# Patient Record
Sex: Female | Born: 1960 | Race: White | Hispanic: No | Marital: Single | Smoking: Never smoker
Health system: Southern US, Community
[De-identification: ages and names within clinical notes are randomized; demographics above are authoritative.]

## PROBLEM LIST (undated history)

## (undated) DIAGNOSIS — F419 Anxiety disorder, unspecified: Secondary | ICD-10-CM

## (undated) DIAGNOSIS — M5416 Radiculopathy, lumbar region: Secondary | ICD-10-CM

## (undated) DIAGNOSIS — M1990 Unspecified osteoarthritis, unspecified site: Secondary | ICD-10-CM

## (undated) DIAGNOSIS — R110 Nausea: Secondary | ICD-10-CM

## (undated) DIAGNOSIS — K219 Gastro-esophageal reflux disease without esophagitis: Secondary | ICD-10-CM

## (undated) DIAGNOSIS — R9431 Abnormal electrocardiogram [ECG] [EKG]: Secondary | ICD-10-CM

## (undated) DIAGNOSIS — I10 Essential (primary) hypertension: Secondary | ICD-10-CM

## (undated) DIAGNOSIS — K8020 Calculus of gallbladder without cholecystitis without obstruction: Secondary | ICD-10-CM

## (undated) DIAGNOSIS — R609 Edema, unspecified: Secondary | ICD-10-CM

## (undated) DIAGNOSIS — E559 Vitamin D deficiency, unspecified: Secondary | ICD-10-CM

## (undated) DIAGNOSIS — E669 Obesity, unspecified: Secondary | ICD-10-CM

## (undated) DIAGNOSIS — N200 Calculus of kidney: Secondary | ICD-10-CM

## (undated) DIAGNOSIS — R739 Hyperglycemia, unspecified: Secondary | ICD-10-CM

## (undated) DIAGNOSIS — K859 Acute pancreatitis without necrosis or infection, unspecified: Secondary | ICD-10-CM

## (undated) DIAGNOSIS — M25561 Pain in right knee: Secondary | ICD-10-CM

## (undated) DIAGNOSIS — E785 Hyperlipidemia, unspecified: Secondary | ICD-10-CM

## (undated) DIAGNOSIS — J309 Allergic rhinitis, unspecified: Secondary | ICD-10-CM

## (undated) DIAGNOSIS — N390 Urinary tract infection, site not specified: Secondary | ICD-10-CM

## (undated) HISTORY — DX: Nausea: R11.0

## (undated) HISTORY — DX: Vitamin D deficiency, unspecified: E55.9

## (undated) HISTORY — DX: Urinary tract infection, site not specified: N39.0

## (undated) HISTORY — DX: Edema, unspecified: R60.9

## (undated) HISTORY — DX: Obesity, unspecified: E66.9

## (undated) HISTORY — DX: Hyperglycemia, unspecified: R73.9

## (undated) HISTORY — DX: Pain in right knee: M25.561

## (undated) HISTORY — DX: Gastro-esophageal reflux disease without esophagitis: K21.9

## (undated) HISTORY — DX: Acute pancreatitis without necrosis or infection, unspecified: K85.90

## (undated) HISTORY — DX: Calculus of kidney: N20.0

## (undated) HISTORY — DX: Radiculopathy, lumbar region: M54.16

## (undated) HISTORY — DX: Calculus of gallbladder without cholecystitis without obstruction: K80.20

## (undated) HISTORY — DX: Anxiety disorder, unspecified: F41.9

## (undated) HISTORY — DX: Unspecified osteoarthritis, unspecified site: M19.90

## (undated) HISTORY — DX: Abnormal electrocardiogram (ECG) (EKG): R94.31

## (undated) HISTORY — DX: Allergic rhinitis, unspecified: J30.9

## (undated) HISTORY — DX: Essential (primary) hypertension: I10

## (undated) HISTORY — DX: Hyperlipidemia, unspecified: E78.5

## (undated) HISTORY — PX: CHOLECYSTECTOMY: SHX55

---
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
## 1981-12-20 HISTORY — PX: OTHER SURGICAL HISTORY: SHX169

## 1986-12-20 HISTORY — PX: CERVICAL ABLATION: SHX5771

## 1999-03-09 ENCOUNTER — Other Ambulatory Visit: Admission: RE | Admit: 1999-03-09 | Discharge: 1999-03-09 | Payer: Self-pay | Admitting: Obstetrics & Gynecology

## 2001-01-02 ENCOUNTER — Other Ambulatory Visit: Admission: RE | Admit: 2001-01-02 | Discharge: 2001-01-02 | Payer: Self-pay | Admitting: Obstetrics & Gynecology

## 2003-07-29 ENCOUNTER — Encounter: Admission: RE | Admit: 2003-07-29 | Discharge: 2003-07-29 | Payer: Self-pay | Admitting: Obstetrics & Gynecology

## 2003-07-29 ENCOUNTER — Other Ambulatory Visit: Admission: RE | Admit: 2003-07-29 | Discharge: 2003-07-29 | Payer: Self-pay | Admitting: Obstetrics & Gynecology

## 2003-07-29 ENCOUNTER — Encounter: Payer: Self-pay | Admitting: Obstetrics & Gynecology

## 2004-01-27 ENCOUNTER — Encounter: Admission: RE | Admit: 2004-01-27 | Discharge: 2004-01-27 | Payer: Self-pay | Admitting: Obstetrics & Gynecology

## 2004-08-10 ENCOUNTER — Other Ambulatory Visit: Admission: RE | Admit: 2004-08-10 | Discharge: 2004-08-10 | Payer: Self-pay | Admitting: Obstetrics & Gynecology

## 2004-08-10 ENCOUNTER — Encounter: Admission: RE | Admit: 2004-08-10 | Discharge: 2004-08-10 | Payer: Self-pay | Admitting: Obstetrics & Gynecology

## 2005-11-22 ENCOUNTER — Other Ambulatory Visit: Admission: RE | Admit: 2005-11-22 | Discharge: 2005-11-22 | Payer: Self-pay | Admitting: Obstetrics & Gynecology

## 2011-01-09 ENCOUNTER — Encounter: Payer: Self-pay | Admitting: Obstetrics & Gynecology

## 2013-05-15 ENCOUNTER — Other Ambulatory Visit: Payer: Self-pay | Admitting: Internal Medicine

## 2013-05-15 ENCOUNTER — Other Ambulatory Visit: Payer: Self-pay | Admitting: Obstetrics & Gynecology

## 2013-05-15 DIAGNOSIS — R928 Other abnormal and inconclusive findings on diagnostic imaging of breast: Secondary | ICD-10-CM

## 2013-10-05 ENCOUNTER — Ambulatory Visit
Admission: RE | Admit: 2013-10-05 | Discharge: 2013-10-05 | Disposition: A | Discharge status: Home / Self Care | Payer: No Typology Code available for payment source | Source: Ambulatory Visit | Attending: Obstetrics & Gynecology | Admitting: Obstetrics & Gynecology

## 2013-10-05 DIAGNOSIS — R928 Other abnormal and inconclusive findings on diagnostic imaging of breast: Secondary | ICD-10-CM

## 2017-04-08 ENCOUNTER — Encounter: Payer: Self-pay | Admitting: Internal Medicine

## 2017-05-13 ENCOUNTER — Ambulatory Visit: Payer: No Typology Code available for payment source | Admitting: Internal Medicine

## 2017-05-23 ENCOUNTER — Ambulatory Visit (INDEPENDENT_AMBULATORY_CARE_PROVIDER_SITE_OTHER): Payer: Managed Care, Other (non HMO) | Admitting: Internal Medicine

## 2017-05-23 ENCOUNTER — Encounter (INDEPENDENT_AMBULATORY_CARE_PROVIDER_SITE_OTHER): Payer: Self-pay

## 2017-05-23 ENCOUNTER — Encounter: Payer: Self-pay | Admitting: Internal Medicine

## 2017-05-23 VITALS — BP 128/84 | HR 74 | Ht 63.0 in | Wt 273.0 lb

## 2017-05-23 DIAGNOSIS — Z1211 Encounter for screening for malignant neoplasm of colon: Secondary | ICD-10-CM | POA: Diagnosis not present

## 2017-05-23 DIAGNOSIS — K529 Noninfective gastroenteritis and colitis, unspecified: Secondary | ICD-10-CM | POA: Diagnosis not present

## 2017-05-23 DIAGNOSIS — R768 Other specified abnormal immunological findings in serum: Secondary | ICD-10-CM

## 2017-05-23 NOTE — Patient Instructions (Signed)
  You have been scheduled for a colonoscopy. Please follow written instructions given to you at your visit today.  Please pick up your prep supplies at the pharmacy. If you use inhalers (even only as needed), please bring them with you on the day of your procedure. Your physician has requested that you go to www.startemmi.com and enter the access code given to you at your visit today. This web site gives a general overview about your procedure. However, you should still follow specific instructions given to you by our office regarding your preparation for the procedure.     I appreciate the opportunity to care for you. Carl Gessner, MD, FACG 
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
## 2017-05-28 ENCOUNTER — Encounter: Payer: Self-pay | Admitting: Internal Medicine

## 2017-05-28 DIAGNOSIS — F419 Anxiety disorder, unspecified: Secondary | ICD-10-CM | POA: Insufficient documentation

## 2017-05-28 DIAGNOSIS — Z6841 Body Mass Index (BMI) 40.0 and over, adult: Secondary | ICD-10-CM

## 2017-08-03 ENCOUNTER — Encounter: Payer: Self-pay | Admitting: Internal Medicine

## 2017-08-03 ENCOUNTER — Ambulatory Visit (AMBULATORY_SURGERY_CENTER): Payer: Managed Care, Other (non HMO) | Admitting: Internal Medicine

## 2017-08-03 VITALS — BP 120/76 | HR 78 | Temp 98.4°F | Resp 19 | Ht 63.0 in | Wt 273.0 lb

## 2017-08-03 DIAGNOSIS — K621 Rectal polyp: Secondary | ICD-10-CM

## 2017-08-03 DIAGNOSIS — Z1211 Encounter for screening for malignant neoplasm of colon: Secondary | ICD-10-CM

## 2017-08-03 DIAGNOSIS — D129 Benign neoplasm of anus and anal canal: Secondary | ICD-10-CM

## 2017-08-03 DIAGNOSIS — Z1212 Encounter for screening for malignant neoplasm of rectum: Secondary | ICD-10-CM | POA: Diagnosis not present

## 2017-08-03 DIAGNOSIS — D128 Benign neoplasm of rectum: Secondary | ICD-10-CM

## 2017-08-03 MED ORDER — SODIUM CHLORIDE 0.9 % IV SOLN: 500.0000 mL | INTRAVENOUS | Status: AC

## 2017-08-03 NOTE — Op Note (Signed)
[facility] Patient Name: Caitlyn Rodriguez Procedure Date: 08/03/2017 9:42 AM MRN: [id number] Endoscopist: Gatha Mayer , MD Age: 56 Referring MD:  Date of Birth: 07/09/1961 Gender: Female Account #: [id number] Procedure:                Colonoscopy Indications:              Screening for colorectal malignant neoplasm, This                            is the patient's first colonoscopy Medicines:                Propofol per Anesthesia, Monitored Anesthesia Care Procedure:                Pre-Anesthesia Assessment:                           - Prior to the procedure, a History and Physical                            was performed, and patient medications and                            allergies were reviewed. The patient's tolerance of                            previous anesthesia was also reviewed. The risks                            and benefits of the procedure and the sedation                            options and risks were discussed with the patient.                            All questions were answered, and informed consent                            was obtained. Prior Anticoagulants: The patient has                            taken no previous anticoagulant or antiplatelet                            agents. ASA Grade Assessment: III - A patient with                            severe systemic disease. After reviewing the risks                            and benefits, the patient was deemed in                            satisfactory condition to undergo the procedure.  After obtaining informed consent, the colonoscope                            was passed under direct vision. Throughout the                            procedure, the patient's blood pressure, pulse, and                            oxygen saturations were monitored continuously. The                            Colonoscope was introduced through the anus and   advanced to the the cecum, identified by                            appendiceal orifice and ileocecal valve. The                            colonoscopy was performed without difficulty. The                            patient tolerated the procedure well. The quality                            of the bowel preparation was excellent. The bowel                            preparation used was Miralax. The ileocecal valve,                            appendiceal orifice, and rectum were photographed. Scope In: 2:95:18 AM Scope Out: 9:56:53 AM Scope Withdrawal Time: 0 hours 7 minutes 54 seconds  Total Procedure Duration: 0 hours 10 minutes 19 seconds  Findings:                 The perianal and digital rectal examinations were                            normal.                           A diminutive polyp was found in the rectum. The                            polyp was sessile. The polyp was removed with a                            cold snare. Resection and retrieval were complete.                            Verification of patient identification for the                            specimen was done.  Estimated blood loss was minimal.                           Scattered diverticula were found in the transverse                            colon.                           The exam was otherwise without abnormality on                            direct and retroflexion views. Complications:            No immediate complications. Estimated Blood Loss:     Estimated blood loss was minimal. Impression:               - One diminutive polyp in the rectum, removed with                            a cold snare. Resected and retrieved.                           - Diverticulosis in the transverse colon.                           - The examination was otherwise normal on direct                            and retroflexion views. Recommendation:           - Patient has a contact number available for                             emergencies. The signs and symptoms of potential                            delayed complications were discussed with the                            patient. Return to normal activities tomorrow.                            Written discharge instructions were provided to the                            patient.                           - Resume previous diet.                           - Continue present medications.                           - Repeat colonoscopy is recommended. The  colonoscopy date will be determined after pathology                            results from today's exam become available for                            review. Gatha Mayer, MD 08/03/2017 10:02:25 AM This report has been signed electronically.

## 2017-08-03 NOTE — Progress Notes (Signed)
Report to PACU, RN, vss, BBS= Clear.  

## 2017-08-03 NOTE — Patient Instructions (Addendum)
I found and removed one small polyp that looks benign.  Mild diverticulosis.  I will let you know pathology results and when to have another routine colonoscopy by mail and/or My Chart. YOU HAD AN ENDOSCOPIC PROCEDURE TODAY AT [facility]:   Refer to the procedure report that was given to you for any specific questions about what was found during the examination.  If the procedure report does not answer your questions, please call your gastroenterologist to clarify.  If you requested that your care partner not be given the details of your procedure findings, then the procedure report has been included in a sealed envelope for you to review at your convenience later.  YOU SHOULD EXPECT: Some feelings of bloating in the abdomen. Passage of more gas than usual.  Walking can help get rid of the air that was put into your GI tract during the procedure and reduce the bloating. If you had a lower endoscopy (such as a colonoscopy or flexible sigmoidoscopy) you may notice spotting of blood in your stool or on the toilet paper. If you underwent a bowel prep for your procedure, you may not have a normal bowel movement for a few days.  Please Note:  You might notice some irritation and congestion in your nose or some drainage.  This is from the oxygen used during your procedure.  There is no need for concern and it should clear up in a day or so.  SYMPTOMS TO REPORT IMMEDIATELY:   Following lower endoscopy (colonoscopy or flexible sigmoidoscopy):  Excessive amounts of blood in the stool  Significant tenderness or worsening of abdominal pains  Swelling of the abdomen that is new, acute  Fever of 100F or higher   Following upper endoscopy (EGD)  Vomiting of blood or coffee ground material  New chest pain or pain under the shoulder blades  Painful or persistently difficult swallowing  New shortness of breath  Fever of 100F or higher  Black, tarry-looking stools  For urgent  or emergent issues, a gastroenterologist can be reached at any hour by calling [phone].   DIET:  We do recommend a small meal at first, but then you may proceed to your regular diet.  Drink plenty of fluids but you should avoid alcoholic beverages for 24 hours.  ACTIVITY:  You should plan to take it easy for the rest of today and you should NOT DRIVE or use heavy machinery until tomorrow (because of the sedation medicines used during the test).    FOLLOW UP: Our staff will call the number listed on your records the next business day following your procedure to check on you and address any questions or concerns that you may have regarding the information given to you following your procedure. If we do not reach you, we will leave a message.  However, if you are feeling well and you are not experiencing any problems, there is no need to return our call.  We will assume that you have returned to your regular daily activities without incident.  If any biopsies were taken you will be contacted by phone or by letter within the next 1-3 weeks.  Please call us at [phone] if you have not heard about the biopsies in 3 weeks.    SIGNATURES/CONFIDENTIALITY: You and/or your care partner have signed paperwork which will be entered into your electronic medical record.  These signatures attest to the fact that that the information above on your After Visit Summary  has been reviewed and is understood.  Full responsibility of the confidentiality of this discharge information lies with you and/or your care-partner.YOU HAD AN ENDOSCOPIC PROCEDURE TODAY AT [facility]:   Refer to the procedure report that was given to you for any specific questions about what was found during the examination.  If the procedure report does not answer your questions, please call your gastroenterologist to clarify.  If you requested that your care partner not be given the details of your procedure findings, then  the procedure report has been included in a sealed envelope for you to review at your convenience later.  YOU SHOULD EXPECT: Some feelings of bloating in the abdomen. Passage of more gas than usual.  Walking can help get rid of the air that was put into your GI tract during the procedure and reduce the bloating. If you had a lower endoscopy (such as a colonoscopy or flexible sigmoidoscopy) you may notice spotting of blood in your stool or on the toilet paper. If you underwent a bowel prep for your procedure, you may not have a normal bowel movement for a few days.  Please Note:  You might notice some irritation and congestion in your nose or some drainage.  This is from the oxygen used during your procedure.  There is no need for concern and it should clear up in a day or so.  SYMPTOMS TO REPORT IMMEDIATELY:   Following lower endoscopy (colonoscopy or flexible sigmoidoscopy):  Excessive amounts of blood in the stool  Significant tenderness or worsening of abdominal pains  Swelling of the abdomen that is new, acute  Fever of 100F or higher  FolFor urgent or emergent issues, a gastroenterologist can be reached at any hour by calling [phone].   DIET:  We do recommend a small meal at first, but then you may proceed to your regular diet.  Drink plenty of fluids but you should avoid alcoholic beverages for 24 hours.  ACTIVITY:  You should plan to take it easy for the rest of today and you should NOT DRIVE or use heavy machinery until tomorrow (because of the sedation medicines used during the test).    FOLLOW UP: Our staff will call the number listed on your records the next business day following your procedure to check on you and address any questions or concerns that you may have regarding the information given to you following your procedure. If we do not reach you, we will leave a message.  However, if you are feeling well and you are not experiencing any problems, there is no need to  return our call.  We will assume that you have returned to your regular daily activities without incident.  If any biopsies were taken you will be contacted by phone or by letter within the next 1-3 weeks.  Please call us at [phone] if you have not heard about the biopsies in 3 weeks.    SIGNATURES/CONFIDENTIALITY: You and/or your care partner have signed paperwork which will be entered into your electronic medical record.  These signatures attest to the fact that that the information above on your After Visit Summary has been reviewed and is understood.  Full responsibility of the confidentiality of this discharge information lies with you and/or your care-partner. I appreciate the opportunity to care for you. Gatha Mayer, MD, [facility]  Polyp and diverticulosis  information given.

## 2017-08-03 NOTE — Progress Notes (Signed)
Called to room to assist during endoscopic procedure.  Patient ID and intended procedure confirmed with present staff. Received instructions for my participation in the procedure from the performing physician.  

## 2017-08-04 ENCOUNTER — Telehealth: Payer: Self-pay

## 2017-08-04 NOTE — Telephone Encounter (Signed)
  Follow up Call-  Call back number 08/03/2017  Post procedure Call Back phone  # [phone]  Permission to leave phone message Yes  Some recent data might be hidden     Patient questions:  Do you have a fever, pain , or abdominal swelling? No. Pain Score  0 *  Have you tolerated food without any problems? Yes.    Have you been able to return to your normal activities? Yes.    Do you have any questions about your discharge instructions: Diet   No. Medications  No. Follow up visit  No.  Do you have questions or concerns about your Care? No.  Actions: * If pain score is 4 or above: No action needed, pain <4.   No problems noted per pt. maw

## 2017-08-09 ENCOUNTER — Encounter: Payer: Self-pay | Admitting: Internal Medicine

## 2017-08-09 NOTE — Progress Notes (Signed)
Hyperplastic rectal polyp recall colon 2028

## 2018-12-19 ENCOUNTER — Other Ambulatory Visit: Payer: Self-pay | Admitting: Obstetrics & Gynecology

## 2018-12-19 ENCOUNTER — Ambulatory Visit
Admission: RE | Admit: 2018-12-19 | Discharge: 2018-12-19 | Disposition: A | Discharge status: Home / Self Care | Payer: Managed Care, Other (non HMO) | Source: Ambulatory Visit | Attending: Obstetrics & Gynecology | Admitting: Obstetrics & Gynecology

## 2018-12-19 ENCOUNTER — Ambulatory Visit
Admission: RE | Admit: 2018-12-19 | Discharge: 2018-12-19 | Disposition: A | Discharge status: Home / Self Care | Payer: 59 | Source: Ambulatory Visit | Attending: Obstetrics & Gynecology | Admitting: Obstetrics & Gynecology

## 2018-12-19 DIAGNOSIS — N6310 Unspecified lump in the right breast, unspecified quadrant: Secondary | ICD-10-CM
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
## 2022-06-17 DIAGNOSIS — M5416 Radiculopathy, lumbar region: Secondary | ICD-10-CM | POA: Diagnosis not present

## 2022-06-17 DIAGNOSIS — F419 Anxiety disorder, unspecified: Secondary | ICD-10-CM | POA: Diagnosis not present

## 2022-08-02 DIAGNOSIS — H401133 Primary open-angle glaucoma, bilateral, severe stage: Secondary | ICD-10-CM | POA: Diagnosis not present

## 2022-10-18 DIAGNOSIS — Z0000 Encounter for general adult medical examination without abnormal findings: Secondary | ICD-10-CM | POA: Diagnosis not present

## 2022-10-18 DIAGNOSIS — Z23 Encounter for immunization: Secondary | ICD-10-CM | POA: Diagnosis not present

## 2022-10-18 DIAGNOSIS — R9431 Abnormal electrocardiogram [ECG] [EKG]: Secondary | ICD-10-CM | POA: Diagnosis not present

## 2022-10-18 DIAGNOSIS — R609 Edema, unspecified: Secondary | ICD-10-CM | POA: Diagnosis not present

## 2022-10-18 DIAGNOSIS — F419 Anxiety disorder, unspecified: Secondary | ICD-10-CM | POA: Diagnosis not present

## 2022-10-18 DIAGNOSIS — I10 Essential (primary) hypertension: Secondary | ICD-10-CM | POA: Diagnosis not present

## 2022-10-25 DIAGNOSIS — M1711 Unilateral primary osteoarthritis, right knee: Secondary | ICD-10-CM | POA: Diagnosis not present

## 2022-12-14 ENCOUNTER — Ambulatory Visit (INDEPENDENT_AMBULATORY_CARE_PROVIDER_SITE_OTHER): Payer: BC Managed Care – PPO

## 2022-12-14 ENCOUNTER — Ambulatory Visit (INDEPENDENT_AMBULATORY_CARE_PROVIDER_SITE_OTHER): Payer: BC Managed Care – PPO | Admitting: Podiatry

## 2022-12-14 DIAGNOSIS — M7750 Other enthesopathy of unspecified foot: Secondary | ICD-10-CM | POA: Diagnosis not present

## 2022-12-14 DIAGNOSIS — S86312A Strain of muscle(s) and tendon(s) of peroneal muscle group at lower leg level, left leg, initial encounter: Secondary | ICD-10-CM | POA: Diagnosis not present

## 2022-12-14 DIAGNOSIS — M7752 Other enthesopathy of left foot: Secondary | ICD-10-CM | POA: Diagnosis not present

## 2022-12-14 MED ORDER — METHYLPREDNISOLONE 4 MG PO TBPK: ORAL_TABLET | ORAL | 0 refills | Status: DC

## 2022-12-18 NOTE — Progress Notes (Signed)
  Subjective:  Patient ID: Caitlyn Rodriguez, female    DOB: 10/15/1961,  MRN: [id number]  Chief Complaint  Patient presents with   Foot Pain    np left foot pain/ can hardly walk on foot. felt like something tore when stepped down a few days ago -has had plantar fasciitis in the past - pain is along the lateral edge of her foot    61 y.o. female presents with the above complaint. History confirmed with patient.  She thought she felt a popping pulling sensation over the weekend  Objective:  Physical Exam: warm, good capillary refill, no trophic changes or ulcerative lesions, normal DP and PT pulses, normal sensory exam, and sharp pain on palpation to plantar mid arch worse with resisted eversion   Radiographs: Multiple views x-ray of the left foot: no fracture, dislocation, swelling or degenerative changes noted Assessment:   1. Peroneal tendon tear, left, initial encounter   2. Tendonitis of ankle or foot      Plan:  Patient was evaluated and treated and all questions answered.  Mr. Symptoms seem to be consistent with injury to the peroneus longus tendon I am suspicious of a possible avulsion injury here.  I recommended immobilization in a short cam boot which was dispensed I discussed rest of the tendon to alleviate this as well.  Methylprednisolone taper was prescribed.  She is currently taking diclofenac twice daily already.  She will return in 1 month for follow-up.  If no improvement would recommend an MRI at that point.  Return in about 1 month (around 01/14/2023).

## 2022-12-23 DIAGNOSIS — R059 Cough, unspecified: Secondary | ICD-10-CM | POA: Diagnosis not present

## 2022-12-23 DIAGNOSIS — J020 Streptococcal pharyngitis: Secondary | ICD-10-CM | POA: Diagnosis not present

## 2022-12-23 DIAGNOSIS — J029 Acute pharyngitis, unspecified: Secondary | ICD-10-CM | POA: Diagnosis not present

## 2023-01-11 ENCOUNTER — Ambulatory Visit: Payer: BC Managed Care – PPO | Admitting: Podiatry

## 2023-01-18 ENCOUNTER — Ambulatory Visit: Payer: BC Managed Care – PPO | Admitting: Podiatry

## 2023-01-27 ENCOUNTER — Ambulatory Visit (INDEPENDENT_AMBULATORY_CARE_PROVIDER_SITE_OTHER): Payer: BC Managed Care – PPO | Admitting: Podiatry

## 2023-01-27 DIAGNOSIS — M7752 Other enthesopathy of left foot: Secondary | ICD-10-CM

## 2023-01-27 DIAGNOSIS — S86312D Strain of muscle(s) and tendon(s) of peroneal muscle group at lower leg level, left leg, subsequent encounter: Secondary | ICD-10-CM | POA: Diagnosis not present

## 2023-01-27 DIAGNOSIS — M7750 Other enthesopathy of unspecified foot: Secondary | ICD-10-CM

## 2023-01-27 NOTE — Patient Instructions (Signed)

## 2023-01-30 ENCOUNTER — Encounter: Payer: Self-pay | Admitting: Podiatry

## 2023-01-30 NOTE — Progress Notes (Signed)
  Subjective:  Patient ID: Caitlyn Rodriguez, female    DOB: 1961/01/03,  MRN: [id number]  Chief Complaint  Patient presents with   Tendonitis    6 week follow up left  - feels better in the boot    62 y.o. female presents with the above complaint. History confirmed with patient.  She has had some improvement the boot has been helpful  Objective:  Physical Exam: warm, good capillary refill, no trophic changes or ulcerative lesions, normal DP and PT pulses, normal sensory exam, and dull aching pain plantar mid arch into peroneal sulcus  Radiographs: Multiple views x-ray of the left foot: no fracture, dislocation, swelling or degenerative changes noted Assessment:   1. Tendonitis of ankle or foot   2. Peroneal tendon tear, left, subsequent encounter      Plan:  Patient was evaluated and treated and all questions answered.  Has had some improvement may transition away from the cam walker boot gradually and to the Tri-Lock ankle brace which I dispensed today to use with a supportive shoe.  Rx for physical therapy was given to her to take she will schedule this with a therapist in [place] which is close to her work.  I will see her back in 2 months for follow-up or as needed if it resolves  No follow-ups on file.

## 2023-02-16 DIAGNOSIS — S86302S Unspecified injury of muscle(s) and tendon(s) of peroneal muscle group at lower leg level, left leg, sequela: Secondary | ICD-10-CM | POA: Diagnosis not present

## 2023-02-16 DIAGNOSIS — S86392A Other injury of muscle(s) and tendon(s) of peroneal muscle group at lower leg level, left leg, initial encounter: Secondary | ICD-10-CM | POA: Diagnosis not present

## 2023-02-16 DIAGNOSIS — M7672 Peroneal tendinitis, left leg: Secondary | ICD-10-CM | POA: Diagnosis not present

## 2023-02-16 DIAGNOSIS — S86992A Other injury of unspecified muscle(s) and tendon(s) at lower leg level, left leg, initial encounter: Secondary | ICD-10-CM | POA: Diagnosis not present

## 2023-02-23 DIAGNOSIS — S86392A Other injury of muscle(s) and tendon(s) of peroneal muscle group at lower leg level, left leg, initial encounter: Secondary | ICD-10-CM | POA: Diagnosis not present

## 2023-02-23 DIAGNOSIS — S86302S Unspecified injury of muscle(s) and tendon(s) of peroneal muscle group at lower leg level, left leg, sequela: Secondary | ICD-10-CM | POA: Diagnosis not present

## 2023-02-23 DIAGNOSIS — S86992A Other injury of unspecified muscle(s) and tendon(s) at lower leg level, left leg, initial encounter: Secondary | ICD-10-CM | POA: Diagnosis not present

## 2023-02-23 DIAGNOSIS — M7672 Peroneal tendinitis, left leg: Secondary | ICD-10-CM | POA: Diagnosis not present

## 2023-02-24 DIAGNOSIS — M1711 Unilateral primary osteoarthritis, right knee: Secondary | ICD-10-CM | POA: Diagnosis not present

## 2023-03-09 DIAGNOSIS — S86302S Unspecified injury of muscle(s) and tendon(s) of peroneal muscle group at lower leg level, left leg, sequela: Secondary | ICD-10-CM | POA: Diagnosis not present

## 2023-03-09 DIAGNOSIS — S86992A Other injury of unspecified muscle(s) and tendon(s) at lower leg level, left leg, initial encounter: Secondary | ICD-10-CM | POA: Diagnosis not present

## 2023-03-09 DIAGNOSIS — M7672 Peroneal tendinitis, left leg: Secondary | ICD-10-CM | POA: Diagnosis not present

## 2023-03-09 DIAGNOSIS — S86392A Other injury of muscle(s) and tendon(s) of peroneal muscle group at lower leg level, left leg, initial encounter: Secondary | ICD-10-CM | POA: Diagnosis not present

## 2023-03-14 DIAGNOSIS — F419 Anxiety disorder, unspecified: Secondary | ICD-10-CM | POA: Diagnosis not present

## 2023-03-14 DIAGNOSIS — M5416 Radiculopathy, lumbar region: Secondary | ICD-10-CM | POA: Diagnosis not present

## 2023-03-14 DIAGNOSIS — I10 Essential (primary) hypertension: Secondary | ICD-10-CM | POA: Diagnosis not present

## 2023-03-14 DIAGNOSIS — R110 Nausea: Secondary | ICD-10-CM | POA: Diagnosis not present

## 2023-03-16 ENCOUNTER — Telehealth: Payer: Self-pay | Admitting: *Deleted

## 2023-03-16 DIAGNOSIS — S86392A Other injury of muscle(s) and tendon(s) of peroneal muscle group at lower leg level, left leg, initial encounter: Secondary | ICD-10-CM | POA: Diagnosis not present

## 2023-03-16 DIAGNOSIS — S86992A Other injury of unspecified muscle(s) and tendon(s) at lower leg level, left leg, initial encounter: Secondary | ICD-10-CM | POA: Diagnosis not present

## 2023-03-16 NOTE — Telephone Encounter (Signed)
Primary Cardiologist:None  Chart reviewed as part of pre-operative protocol coverage. Because of Caitlyn Rodriguez's past medical history and time since last visit, he/she will require a follow-up visit in order to better assess preoperative cardiovascular risk.  Pre-op covering staff: -Patient has an appointment with Dr. Johney Frame on 03/21/2023 at which time clearance will be addressed.  Appointment notes have been updated. - Please contact requesting surgeon's office via preferred method (i.e, phone, fax) to inform them of need for appointment prior to surgery.  Caitlyn Life, NP-C  03/16/2023, 10:31 AM [address] Office [phone] Fax [phone]

## 2023-03-16 NOTE — Telephone Encounter (Signed)
**  PT ALREADY SCHEDULED FOR A NEW PT APPOINTMENT WITH DR. Johney Frame 03/21/23, WILL UPDATE APPOINTMENT INFORMATION**     Pre-operative Risk Assessment    Patient Name: Caitlyn Rodriguez  DOB: 08-05-1961 MRN: [id number]      Request for Surgical Clearance    Procedure:   RIGHT TOTAL KNEE ARTHROPLASTY  Date of Surgery:  Clearance 04/18/23                                 Surgeon:  DR. MATTHEW OLIN Surgeon's Group or Practice Name:  [facility] Phone number:  [phone] Fax number:  [phone]   Type of Clearance Requested:   - Medical    Type of Anesthesia:  Spinal   Additional requests/questions:    Astrid Divine   03/16/2023, 9:39 AM

## 2023-03-17 DIAGNOSIS — M1711 Unilateral primary osteoarthritis, right knee: Secondary | ICD-10-CM | POA: Diagnosis not present

## 2023-03-21 ENCOUNTER — Encounter: Payer: Self-pay | Admitting: Internal Medicine

## 2023-03-21 ENCOUNTER — Ambulatory Visit: Payer: 59 | Admitting: Cardiology

## 2023-03-21 ENCOUNTER — Ambulatory Visit: Payer: BC Managed Care – PPO | Attending: Cardiology | Admitting: Internal Medicine

## 2023-03-21 VITALS — BP 118/70 | HR 69 | Ht 63.0 in | Wt 211.0 lb

## 2023-03-21 DIAGNOSIS — Z01810 Encounter for preprocedural cardiovascular examination: Secondary | ICD-10-CM | POA: Diagnosis not present

## 2023-03-21 NOTE — Patient Instructions (Signed)
Medication Instructions:  Your physician recommends that you continue on your current medications as directed. Please refer to the Current Medication list given to you today.  *If you need a refill on your cardiac medications before your next appointment, please call your pharmacy*   Lab Work: NONE If you have labs (blood work) drawn today and your tests are completely normal, you will receive your results only by: [place] (if you have MyChart) OR A paper copy in the mail If you have any lab test that is abnormal or we need to change your treatment, we will call you to review the results.   Testing/Procedures: NONE   Follow-Up: At [facility], you and your health needs are our priority.  As part of our continuing mission to provide you with exceptional heart care, we have created designated Provider Care Teams.  These Care Teams include your primary Cardiologist (physician) and Advanced Practice Providers (APPs -  Physician Assistants and Nurse Practitioners) who all work together to provide you with the care you need, when you need it.  We recommend signing up for the patient portal called "MyChart".  Sign up information is provided on this After Visit Summary.  MyChart is used to connect with patients for Virtual Visits (Telemedicine).  Patients are able to view lab/test results, encounter notes, upcoming appointments, etc.  Non-urgent messages can be sent to your provider as well.   To learn more about what you can do with MyChart, go to [email or web address].    Your next appointment:   Aug   Provider:   Rudean Haskell, MD
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
## 2023-03-24 DIAGNOSIS — S86992A Other injury of unspecified muscle(s) and tendon(s) at lower leg level, left leg, initial encounter: Secondary | ICD-10-CM | POA: Diagnosis not present

## 2023-03-24 DIAGNOSIS — S86392A Other injury of muscle(s) and tendon(s) of peroneal muscle group at lower leg level, left leg, initial encounter: Secondary | ICD-10-CM | POA: Diagnosis not present

## 2023-03-28 ENCOUNTER — Ambulatory Visit: Payer: BC Managed Care – PPO | Admitting: Podiatry

## 2023-03-30 ENCOUNTER — Ambulatory Visit (INDEPENDENT_AMBULATORY_CARE_PROVIDER_SITE_OTHER): Payer: BC Managed Care – PPO | Admitting: Podiatry

## 2023-03-30 DIAGNOSIS — M7750 Other enthesopathy of unspecified foot: Secondary | ICD-10-CM | POA: Diagnosis not present

## 2023-04-03 NOTE — Progress Notes (Signed)
  Subjective:  Patient ID: Caitlyn Rodriguez, female    DOB: 09/25/1961,  MRN: [id number]  Chief Complaint  Patient presents with   Tendonitis    for Tendonitis of ankle or foot    62 y.o. female presents with the above complaint. History confirmed with patient.  She is doing well not having much pain at all now physical therapy has been helpful she is out of the brace  Objective:  Physical Exam: warm, good capillary refill, no trophic changes or ulcerative lesions, normal DP and PT pulses, normal sensory exam, and no pain today to palpation no pain with resisted eversion inversion plantarflexion dorsiflexion doing very well  Radiographs: Multiple views x-ray of the left foot: no fracture, dislocation, swelling or degenerative changes noted Assessment:   1. Tendonitis of ankle or foot       Plan:  Patient was evaluated and treated and all questions answered.  Overall doing very well and is nearly completed her physical therapy, she may begin resuming regular activity exercise and shoe gear as tolerated.  Discussed possibility of recurrence.  Return to see me as needed if it does not improve or worsens  Return if symptoms worsen or fail to improve.

## 2023-04-06 DIAGNOSIS — S86392A Other injury of muscle(s) and tendon(s) of peroneal muscle group at lower leg level, left leg, initial encounter: Secondary | ICD-10-CM | POA: Diagnosis not present

## 2023-04-06 DIAGNOSIS — S86992A Other injury of unspecified muscle(s) and tendon(s) at lower leg level, left leg, initial encounter: Secondary | ICD-10-CM | POA: Diagnosis not present

## 2023-04-13 DIAGNOSIS — S86392A Other injury of muscle(s) and tendon(s) of peroneal muscle group at lower leg level, left leg, initial encounter: Secondary | ICD-10-CM | POA: Diagnosis not present

## 2023-04-13 DIAGNOSIS — S86992A Other injury of unspecified muscle(s) and tendon(s) at lower leg level, left leg, initial encounter: Secondary | ICD-10-CM | POA: Diagnosis not present

## 2023-04-18 DIAGNOSIS — M1711 Unilateral primary osteoarthritis, right knee: Secondary | ICD-10-CM | POA: Diagnosis not present

## 2023-04-18 DIAGNOSIS — G8918 Other acute postprocedural pain: Secondary | ICD-10-CM | POA: Diagnosis not present

## 2023-04-18 DIAGNOSIS — Z96651 Presence of right artificial knee joint: Secondary | ICD-10-CM | POA: Diagnosis not present

## 2023-04-20 DIAGNOSIS — M2351 Chronic instability of knee, right knee: Secondary | ICD-10-CM | POA: Diagnosis not present

## 2023-04-20 DIAGNOSIS — Z96651 Presence of right artificial knee joint: Secondary | ICD-10-CM | POA: Diagnosis not present

## 2023-04-22 DIAGNOSIS — Z96651 Presence of right artificial knee joint: Secondary | ICD-10-CM | POA: Diagnosis not present

## 2023-04-22 DIAGNOSIS — M2351 Chronic instability of knee, right knee: Secondary | ICD-10-CM | POA: Diagnosis not present

## 2023-04-26 DIAGNOSIS — Z96651 Presence of right artificial knee joint: Secondary | ICD-10-CM | POA: Diagnosis not present

## 2023-04-26 DIAGNOSIS — M2351 Chronic instability of knee, right knee: Secondary | ICD-10-CM | POA: Diagnosis not present

## 2023-04-28 DIAGNOSIS — M2351 Chronic instability of knee, right knee: Secondary | ICD-10-CM | POA: Diagnosis not present

## 2023-04-28 DIAGNOSIS — Z96651 Presence of right artificial knee joint: Secondary | ICD-10-CM | POA: Diagnosis not present

## 2023-05-04 DIAGNOSIS — M2351 Chronic instability of knee, right knee: Secondary | ICD-10-CM | POA: Diagnosis not present

## 2023-05-04 DIAGNOSIS — Z96651 Presence of right artificial knee joint: Secondary | ICD-10-CM | POA: Diagnosis not present

## 2023-05-06 DIAGNOSIS — M2351 Chronic instability of knee, right knee: Secondary | ICD-10-CM | POA: Diagnosis not present

## 2023-05-06 DIAGNOSIS — Z96651 Presence of right artificial knee joint: Secondary | ICD-10-CM | POA: Diagnosis not present

## 2023-05-09 DIAGNOSIS — Z96651 Presence of right artificial knee joint: Secondary | ICD-10-CM | POA: Diagnosis not present

## 2023-05-09 DIAGNOSIS — M2351 Chronic instability of knee, right knee: Secondary | ICD-10-CM | POA: Diagnosis not present

## 2023-05-11 DIAGNOSIS — Z96651 Presence of right artificial knee joint: Secondary | ICD-10-CM | POA: Diagnosis not present

## 2023-05-11 DIAGNOSIS — M2351 Chronic instability of knee, right knee: Secondary | ICD-10-CM | POA: Diagnosis not present

## 2023-05-18 DIAGNOSIS — Z96651 Presence of right artificial knee joint: Secondary | ICD-10-CM | POA: Diagnosis not present

## 2023-05-18 DIAGNOSIS — M2351 Chronic instability of knee, right knee: Secondary | ICD-10-CM | POA: Diagnosis not present

## 2023-05-24 DIAGNOSIS — M2351 Chronic instability of knee, right knee: Secondary | ICD-10-CM | POA: Diagnosis not present

## 2023-05-24 DIAGNOSIS — Z96651 Presence of right artificial knee joint: Secondary | ICD-10-CM | POA: Diagnosis not present

## 2023-05-26 DIAGNOSIS — Z96651 Presence of right artificial knee joint: Secondary | ICD-10-CM | POA: Diagnosis not present

## 2023-05-26 DIAGNOSIS — M2351 Chronic instability of knee, right knee: Secondary | ICD-10-CM | POA: Diagnosis not present

## 2023-06-02 DIAGNOSIS — Z471 Aftercare following joint replacement surgery: Secondary | ICD-10-CM | POA: Diagnosis not present

## 2023-06-02 DIAGNOSIS — Z96651 Presence of right artificial knee joint: Secondary | ICD-10-CM | POA: Diagnosis not present

## 2023-06-06 DIAGNOSIS — D225 Melanocytic nevi of trunk: Secondary | ICD-10-CM | POA: Diagnosis not present

## 2023-06-06 DIAGNOSIS — Z1283 Encounter for screening for malignant neoplasm of skin: Secondary | ICD-10-CM | POA: Diagnosis not present

## 2023-06-06 DIAGNOSIS — D2271 Melanocytic nevi of right lower limb, including hip: Secondary | ICD-10-CM | POA: Diagnosis not present

## 2023-06-06 DIAGNOSIS — D485 Neoplasm of uncertain behavior of skin: Secondary | ICD-10-CM | POA: Diagnosis not present

## 2023-06-20 DIAGNOSIS — H25813 Combined forms of age-related cataract, bilateral: Secondary | ICD-10-CM | POA: Diagnosis not present

## 2023-06-20 DIAGNOSIS — H401133 Primary open-angle glaucoma, bilateral, severe stage: Secondary | ICD-10-CM | POA: Diagnosis not present

## 2023-06-27 DIAGNOSIS — Z1231 Encounter for screening mammogram for malignant neoplasm of breast: Secondary | ICD-10-CM | POA: Diagnosis not present

## 2023-06-27 DIAGNOSIS — Z01419 Encounter for gynecological examination (general) (routine) without abnormal findings: Secondary | ICD-10-CM | POA: Diagnosis not present

## 2023-06-27 DIAGNOSIS — Z6833 Body mass index (BMI) 33.0-33.9, adult: Secondary | ICD-10-CM | POA: Diagnosis not present

## 2023-06-27 DIAGNOSIS — M1712 Unilateral primary osteoarthritis, left knee: Secondary | ICD-10-CM | POA: Diagnosis not present

## 2023-06-29 DIAGNOSIS — I10 Essential (primary) hypertension: Secondary | ICD-10-CM | POA: Diagnosis not present

## 2023-07-12 DIAGNOSIS — N390 Urinary tract infection, site not specified: Secondary | ICD-10-CM | POA: Diagnosis not present

## 2023-07-12 DIAGNOSIS — Z0189 Encounter for other specified special examinations: Secondary | ICD-10-CM | POA: Diagnosis not present

## 2023-07-18 DIAGNOSIS — M1712 Unilateral primary osteoarthritis, left knee: Secondary | ICD-10-CM | POA: Diagnosis not present

## 2023-07-18 DIAGNOSIS — G8918 Other acute postprocedural pain: Secondary | ICD-10-CM | POA: Diagnosis not present

## 2023-07-18 DIAGNOSIS — M25762 Osteophyte, left knee: Secondary | ICD-10-CM | POA: Diagnosis not present

## 2023-07-19 DIAGNOSIS — H401123 Primary open-angle glaucoma, left eye, severe stage: Secondary | ICD-10-CM | POA: Diagnosis not present

## 2023-07-19 DIAGNOSIS — H401132 Primary open-angle glaucoma, bilateral, moderate stage: Secondary | ICD-10-CM | POA: Diagnosis not present

## 2023-07-19 DIAGNOSIS — H401111 Primary open-angle glaucoma, right eye, mild stage: Secondary | ICD-10-CM | POA: Diagnosis not present

## 2023-07-20 DIAGNOSIS — M1712 Unilateral primary osteoarthritis, left knee: Secondary | ICD-10-CM | POA: Diagnosis not present

## 2023-07-20 DIAGNOSIS — Z471 Aftercare following joint replacement surgery: Secondary | ICD-10-CM | POA: Diagnosis not present

## 2023-07-21 ENCOUNTER — Encounter: Payer: Self-pay | Admitting: Internal Medicine

## 2023-07-21 ENCOUNTER — Ambulatory Visit: Payer: BC Managed Care – PPO | Attending: Internal Medicine | Admitting: Internal Medicine
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
## 2023-07-22 DIAGNOSIS — Z471 Aftercare following joint replacement surgery: Secondary | ICD-10-CM | POA: Diagnosis not present

## 2023-07-22 DIAGNOSIS — M1712 Unilateral primary osteoarthritis, left knee: Secondary | ICD-10-CM | POA: Diagnosis not present

## 2023-07-26 DIAGNOSIS — M1712 Unilateral primary osteoarthritis, left knee: Secondary | ICD-10-CM | POA: Diagnosis not present

## 2023-07-26 DIAGNOSIS — Z471 Aftercare following joint replacement surgery: Secondary | ICD-10-CM | POA: Diagnosis not present

## 2023-07-28 DIAGNOSIS — M1712 Unilateral primary osteoarthritis, left knee: Secondary | ICD-10-CM | POA: Diagnosis not present

## 2023-07-28 DIAGNOSIS — Z471 Aftercare following joint replacement surgery: Secondary | ICD-10-CM | POA: Diagnosis not present

## 2023-08-01 DIAGNOSIS — Z471 Aftercare following joint replacement surgery: Secondary | ICD-10-CM | POA: Diagnosis not present

## 2023-08-01 DIAGNOSIS — M1712 Unilateral primary osteoarthritis, left knee: Secondary | ICD-10-CM | POA: Diagnosis not present

## 2023-08-05 DIAGNOSIS — Z471 Aftercare following joint replacement surgery: Secondary | ICD-10-CM | POA: Diagnosis not present

## 2023-08-05 DIAGNOSIS — M1712 Unilateral primary osteoarthritis, left knee: Secondary | ICD-10-CM | POA: Diagnosis not present

## 2023-08-08 DIAGNOSIS — Z471 Aftercare following joint replacement surgery: Secondary | ICD-10-CM | POA: Diagnosis not present

## 2023-08-08 DIAGNOSIS — M1712 Unilateral primary osteoarthritis, left knee: Secondary | ICD-10-CM | POA: Diagnosis not present

## 2023-08-11 DIAGNOSIS — M1712 Unilateral primary osteoarthritis, left knee: Secondary | ICD-10-CM | POA: Diagnosis not present

## 2023-08-11 DIAGNOSIS — Z471 Aftercare following joint replacement surgery: Secondary | ICD-10-CM | POA: Diagnosis not present

## 2023-08-16 DIAGNOSIS — M1712 Unilateral primary osteoarthritis, left knee: Secondary | ICD-10-CM | POA: Diagnosis not present

## 2023-08-16 DIAGNOSIS — Z471 Aftercare following joint replacement surgery: Secondary | ICD-10-CM | POA: Diagnosis not present

## 2023-08-18 DIAGNOSIS — M1712 Unilateral primary osteoarthritis, left knee: Secondary | ICD-10-CM | POA: Diagnosis not present

## 2023-08-18 DIAGNOSIS — Z471 Aftercare following joint replacement surgery: Secondary | ICD-10-CM | POA: Diagnosis not present

## 2023-08-23 DIAGNOSIS — Z471 Aftercare following joint replacement surgery: Secondary | ICD-10-CM | POA: Diagnosis not present

## 2023-08-23 DIAGNOSIS — M1712 Unilateral primary osteoarthritis, left knee: Secondary | ICD-10-CM | POA: Diagnosis not present

## 2023-08-25 DIAGNOSIS — Z471 Aftercare following joint replacement surgery: Secondary | ICD-10-CM | POA: Diagnosis not present

## 2023-08-25 DIAGNOSIS — M1712 Unilateral primary osteoarthritis, left knee: Secondary | ICD-10-CM | POA: Diagnosis not present

## 2023-08-29 DIAGNOSIS — Z96652 Presence of left artificial knee joint: Secondary | ICD-10-CM | POA: Diagnosis not present

## 2023-08-29 DIAGNOSIS — Z471 Aftercare following joint replacement surgery: Secondary | ICD-10-CM | POA: Diagnosis not present

## 2023-09-16 ENCOUNTER — Ambulatory Visit (INDEPENDENT_AMBULATORY_CARE_PROVIDER_SITE_OTHER): Payer: BC Managed Care – PPO

## 2023-09-16 ENCOUNTER — Ambulatory Visit (INDEPENDENT_AMBULATORY_CARE_PROVIDER_SITE_OTHER): Payer: BC Managed Care – PPO | Admitting: Podiatry

## 2023-09-16 ENCOUNTER — Encounter: Payer: Self-pay | Admitting: Podiatry

## 2023-09-16 DIAGNOSIS — S99919A Unspecified injury of unspecified ankle, initial encounter: Secondary | ICD-10-CM

## 2023-09-16 DIAGNOSIS — S93491A Sprain of other ligament of right ankle, initial encounter: Secondary | ICD-10-CM
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
## 2024-01-05 DIAGNOSIS — J019 Acute sinusitis, unspecified: Secondary | ICD-10-CM | POA: Diagnosis not present

## 2024-01-09 DIAGNOSIS — R7309 Other abnormal glucose: Secondary | ICD-10-CM | POA: Diagnosis not present

## 2024-01-09 DIAGNOSIS — E559 Vitamin D deficiency, unspecified: Secondary | ICD-10-CM | POA: Diagnosis not present

## 2024-01-09 DIAGNOSIS — E785 Hyperlipidemia, unspecified: Secondary | ICD-10-CM | POA: Diagnosis not present

## 2024-01-09 DIAGNOSIS — I10 Essential (primary) hypertension: Secondary | ICD-10-CM | POA: Diagnosis not present

## 2024-01-09 DIAGNOSIS — Z0000 Encounter for general adult medical examination without abnormal findings: Secondary | ICD-10-CM | POA: Diagnosis not present

## 2024-01-23 DIAGNOSIS — H25813 Combined forms of age-related cataract, bilateral: Secondary | ICD-10-CM | POA: Diagnosis not present

## 2024-01-23 DIAGNOSIS — H401132 Primary open-angle glaucoma, bilateral, moderate stage: Secondary | ICD-10-CM | POA: Diagnosis not present

## 2024-02-01 DIAGNOSIS — J069 Acute upper respiratory infection, unspecified: Secondary | ICD-10-CM | POA: Diagnosis not present

## 2024-02-10 DIAGNOSIS — Z13820 Encounter for screening for osteoporosis: Secondary | ICD-10-CM | POA: Diagnosis not present

## 2024-03-08 DIAGNOSIS — E669 Obesity, unspecified: Secondary | ICD-10-CM | POA: Diagnosis not present

## 2024-03-08 DIAGNOSIS — F419 Anxiety disorder, unspecified: Secondary | ICD-10-CM | POA: Diagnosis not present

## 2024-04-26 ENCOUNTER — Ambulatory Visit: Payer: BC Managed Care – PPO | Attending: Internal Medicine | Admitting: Internal Medicine

## 2024-04-26 VITALS — BP 99/66 | HR 78 | Ht 63.0 in | Wt 185.0 lb

## 2024-04-26 DIAGNOSIS — I10 Essential (primary) hypertension: Secondary | ICD-10-CM

## 2024-04-26 DIAGNOSIS — Z01810 Encounter for preprocedural cardiovascular examination: Secondary | ICD-10-CM

## 2024-04-26 DIAGNOSIS — E782 Mixed hyperlipidemia: Secondary | ICD-10-CM

## 2024-04-26 DIAGNOSIS — I959 Hypotension, unspecified: Secondary | ICD-10-CM

## 2024-04-26 MED ORDER — LISINOPRIL 5 MG PO TABS: 5.0000 mg | ORAL_TABLET | Freq: Every day | ORAL | 3 refills | Status: AC
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
## 2024-04-26 NOTE — Patient Instructions (Signed)
 Medication Instructions: Your physician has recommended you make the following change in your medication:  DECREASE: lisinopril to 5 mg by mouth once daily Please monitor your BP  *If you need a refill on your cardiac medications before your next appointment, please call your pharmacy*  Lab Work: IN 1-2 WEEKS at any [facility]: BMP, [facility]  If you have labs (blood work) drawn today and your tests are completely normal, you will receive your results only by: MyChart Message (if you have MyChart) OR A paper copy in the mail If you have any lab test that is abnormal or we need to change your treatment, we will call you to review the results.  Testing/Procedures: NONE  Follow-Up: At [facility], you and your health needs are our priority.  As part of our continuing mission to provide you with exceptional heart care, our providers are all part of one team.  This team includes your primary Cardiologist (physician) and Advanced Practice Providers or APPs (Physician Assistants and Nurse Practitioners) who all work together to provide you with the care you need, when you need it.  Your next appointment:   12 month(s)  Provider:   Jann Melody, MD

## 2024-05-03 DIAGNOSIS — M67462 Ganglion, left knee: Secondary | ICD-10-CM | POA: Diagnosis not present

## 2024-05-03 DIAGNOSIS — Z96652 Presence of left artificial knee joint: Secondary | ICD-10-CM | POA: Diagnosis not present

## 2024-05-03 DIAGNOSIS — Z96651 Presence of right artificial knee joint: Secondary | ICD-10-CM | POA: Diagnosis not present

## 2024-05-07 DIAGNOSIS — E782 Mixed hyperlipidemia: Secondary | ICD-10-CM | POA: Diagnosis not present

## 2024-05-07 DIAGNOSIS — Z01810 Encounter for preprocedural cardiovascular examination: Secondary | ICD-10-CM | POA: Diagnosis not present

## 2024-05-07 DIAGNOSIS — I959 Hypotension, unspecified: Secondary | ICD-10-CM | POA: Diagnosis not present

## 2024-05-08 ENCOUNTER — Ambulatory Visit: Payer: Self-pay | Admitting: Internal Medicine

## 2024-05-08 DIAGNOSIS — E782 Mixed hyperlipidemia: Secondary | ICD-10-CM

## 2024-05-08 LAB — BASIC METABOLIC PANEL WITH GFR
BUN/Creatinine Ratio: 25 (ref 12–28)
BUN: 13 mg/dL (ref 8–27)
CO2: 21 mmol/L (ref 20–29)
Calcium: 9.6 mg/dL (ref 8.7–10.3)
Chloride: 105 mmol/L (ref 96–106)
Creatinine, Ser: 0.53 mg/dL — ABNORMAL LOW (ref 0.57–1.00)
Glucose: 76 mg/dL (ref 70–99)
Potassium: 4.8 mmol/L (ref 3.5–5.2)
Sodium: 141 mmol/L (ref 134–144)
eGFR: 105 mL/min/{1.73_m2} (ref >59)

## 2024-05-08 LAB — LIPID PANEL
Chol/HDL Ratio: 3.5 ratio (ref 0.0–4.4)
Cholesterol, Total: 188 mg/dL (ref 100–199)
HDL: 53 mg/dL (ref >39)
LDL Chol Calc (NIH): 115 mg/dL — ABNORMAL HIGH (ref 0–99)
Triglycerides: 114 mg/dL (ref 0–149)
VLDL Cholesterol Cal: 20 mg/dL (ref 5–40)

## 2024-05-09 NOTE — Telephone Encounter (Signed)
 Patient is returning phone call.

## 2024-05-10 NOTE — Telephone Encounter (Signed)
 The patient has been notified of the result and verbalized understanding.  All questions (if any) were answered. Caitlyn Cozier, RN 05/10/2024 5:25 PM   Placed order for Calcium Score test advised of $99 OOP cost.  Pt is agreeable to plan.

## 2024-05-16 ENCOUNTER — Ambulatory Visit (HOSPITAL_COMMUNITY)
Admission: RE | Admit: 2024-05-16 | Discharge: 2024-05-16 | Disposition: A | Discharge status: Home / Self Care | Payer: Self-pay | Source: Ambulatory Visit | Attending: Internal Medicine | Admitting: Internal Medicine

## 2024-05-16 DIAGNOSIS — E782 Mixed hyperlipidemia: Secondary | ICD-10-CM | POA: Insufficient documentation

## 2024-05-18 ENCOUNTER — Ambulatory Visit: Payer: Self-pay

## 2024-05-18 ENCOUNTER — Telehealth: Payer: Self-pay | Admitting: Internal Medicine

## 2024-05-18 DIAGNOSIS — Z8249 Family history of ischemic heart disease and other diseases of the circulatory system: Secondary | ICD-10-CM

## 2024-05-18 DIAGNOSIS — E782 Mixed hyperlipidemia: Secondary | ICD-10-CM

## 2024-05-18 DIAGNOSIS — I491 Atrial premature depolarization: Secondary | ICD-10-CM

## 2024-05-18 MED ORDER — ROSUVASTATIN CALCIUM 5 MG PO TABS: 5.0000 mg | ORAL_TABLET | Freq: Every day | ORAL | 3 refills | Status: AC

## 2024-05-18 MED ORDER — ASPIRIN 81 MG PO TBEC: 81.0000 mg | DELAYED_RELEASE_TABLET | Freq: Every day | ORAL | 3 refills | Status: DC

## 2024-05-18 MED ORDER — ASPIRIN 81 MG PO TBEC: 81.0000 mg | DELAYED_RELEASE_TABLET | Freq: Every day | ORAL | Status: AC

## 2024-05-18 NOTE — Telephone Encounter (Signed)
 RX for ASA EC 81 mg sent to [facility] in [place], [place].

## 2024-05-18 NOTE — Telephone Encounter (Signed)
 Called pt to inform her that her medication aspirin was prescribed over-the-counter, so pt can get this medication without it being sent to her pharmacy. I advised the pt that if she has any other problems, questions or concerns, to give our office a call back. Pt verbalized understanding.

## 2024-05-18 NOTE — Telephone Encounter (Signed)
*  STAT* If patient is at the pharmacy, call can be transferred to refill team.   1. Which medications need to be refilled? (please list name of each medication and dose if known) aspirin  EC 81 MG tablet    2. Would you like to learn more about the convenience, safety, & potential cost savings by using the [facility]?    3. Are you open to using the [facility] (Type [facility].  ).   4. Which pharmacy/location (including street and city if local pharmacy) is medication to be sent to? [facility] - [place], [place] - [address]    5. Do they need a 30 day or 90 day supply? 90 day

## 2024-05-21 NOTE — Telephone Encounter (Signed)
 Called pt reviewed MD recommendation:  Exercise NM Stress test is reasonable.    Risks are low:  Arrhythmias: Abnormal heart rhythms can occur, although they usually resolve quickly.  Chest Pain: Some individuals may experience chest pain or discomfort during the test.  Heart Attack: While rare, a heart attack is a possible, albeit infrequent, risk.  Allergic Reactions: An allergic reaction to the radioactive tracer can occur.  Other Side Effects: Nausea, headache, flushing, or shortness of breath can occur, but are usually mild.    If she is amenable we can proceed.    Gloriann Larger, MD FASE [facility]    Verbally reviewed NMST instructions pt was able to write down instructions.

## 2024-05-23 DIAGNOSIS — Z1283 Encounter for screening for malignant neoplasm of skin: Secondary | ICD-10-CM | POA: Diagnosis not present

## 2024-05-23 DIAGNOSIS — B0089 Other herpesviral infection: Secondary | ICD-10-CM | POA: Diagnosis not present

## 2024-05-23 DIAGNOSIS — D225 Melanocytic nevi of trunk: Secondary | ICD-10-CM | POA: Diagnosis not present

## 2024-05-27 ENCOUNTER — Other Ambulatory Visit: Payer: Self-pay | Admitting: Internal Medicine

## 2024-05-27 DIAGNOSIS — Z01810 Encounter for preprocedural cardiovascular examination: Secondary | ICD-10-CM

## 2024-05-29 ENCOUNTER — Telehealth: Payer: Self-pay | Admitting: Internal Medicine

## 2024-05-29 NOTE — Telephone Encounter (Signed)
 Patient would like to know when she needs to have her lab work done. Shows 8/30 as the expected time frame. Patient would like to know why she would have to wait until August. Please advise.

## 2024-05-29 NOTE — Telephone Encounter (Signed)
 Returned call to patient- patient states she got a notice from [facility] to have labs done. Advised patient to ignore notice and have labs done in August.   Patient expresses frustration about her test not being scheduled. She states her Siegfried Dress has been pending for 5 days. Attempted to explain to patient that we cannot schedule without insurance prior auth approval- middway through this sentence pt hung up call.

## 2024-05-31 ENCOUNTER — Other Ambulatory Visit: Payer: Self-pay | Admitting: Internal Medicine

## 2024-05-31 DIAGNOSIS — E782 Mixed hyperlipidemia: Secondary | ICD-10-CM

## 2024-05-31 DIAGNOSIS — I491 Atrial premature depolarization: Secondary | ICD-10-CM

## 2024-05-31 DIAGNOSIS — Z8249 Family history of ischemic heart disease and other diseases of the circulatory system: Secondary | ICD-10-CM

## 2024-05-31 NOTE — Telephone Encounter (Signed)
 Left message for pt to call, her nuclear stress testing is approved and she can call to get scheduled.

## 2024-05-31 NOTE — Telephone Encounter (Signed)
Routing back to triage.

## 2024-05-31 NOTE — Telephone Encounter (Signed)
 Patient given detailed instructions per Myocardial Perfusion Study Information Sheet for the test on 06/04/24 Patient notified to arrive 15 minutes early and that it is imperative to arrive on time for appointment to keep from having the test rescheduled.  If you need to cancel or reschedule your appointment, please call the office within 24 hours of your appointment. . Patient verbalized understanding. Caitlyn Rodriguez

## 2024-06-01 ENCOUNTER — Ambulatory Visit (HOSPITAL_COMMUNITY)

## 2024-06-01 ENCOUNTER — Ambulatory Visit (HOSPITAL_COMMUNITY)
Admission: RE | Admit: 2024-06-01 | Discharge: 2024-06-01 | Disposition: A | Discharge status: Home / Self Care | Source: Ambulatory Visit | Attending: Internal Medicine | Admitting: Internal Medicine

## 2024-06-01 DIAGNOSIS — E782 Mixed hyperlipidemia: Secondary | ICD-10-CM | POA: Insufficient documentation

## 2024-06-01 DIAGNOSIS — Z8249 Family history of ischemic heart disease and other diseases of the circulatory system: Secondary | ICD-10-CM | POA: Insufficient documentation

## 2024-06-01 DIAGNOSIS — I491 Atrial premature depolarization: Secondary | ICD-10-CM | POA: Insufficient documentation

## 2024-06-01 LAB — MYOCARDIAL PERFUSION IMAGING
Angina Index: 0
Duke Treadmill Score: 7
Estimated workload: 7
Exercise duration (min): 7 min
Exercise duration (sec): 15 s
LV dias vol: 90 mL (ref 46–106)
LV sys vol: 33 mL (ref 3.8–5.2)
MPHR: 159 {beats}/min
Nuc Stress EF: 63 %
Peak HR: 144 {beats}/min
Percent HR: 90 %
Rest HR: 68 {beats}/min
Rest Nuclear Isotope Dose: 10.2 mCi
SRS: 2
SSS: 0
ST Depression (mm): 0 mm
Stress Nuclear Isotope Dose: 32.5 mCi
TID: 1.02

## 2024-06-01 MED ORDER — TECHNETIUM TC 99M TETROFOSMIN IV KIT
32.5000 | PACK | Freq: Once | INTRAVENOUS | Status: AC | PRN
  Administered 2024-06-01: 32.5 via INTRAVENOUS

## 2024-06-01 MED ORDER — TECHNETIUM TC 99M TETROFOSMIN IV KIT
10.2000 | PACK | Freq: Once | INTRAVENOUS | Status: AC | PRN
  Administered 2024-06-01: 10.2 via INTRAVENOUS

## 2024-06-02 ENCOUNTER — Ambulatory Visit: Payer: Self-pay | Admitting: Internal Medicine

## 2024-06-04 ENCOUNTER — Encounter (HOSPITAL_COMMUNITY)

## 2024-06-11 DIAGNOSIS — I10 Essential (primary) hypertension: Secondary | ICD-10-CM | POA: Diagnosis not present

## 2024-06-11 DIAGNOSIS — I2510 Atherosclerotic heart disease of native coronary artery without angina pectoris: Secondary | ICD-10-CM | POA: Diagnosis not present

## 2024-06-11 DIAGNOSIS — E669 Obesity, unspecified: Secondary | ICD-10-CM | POA: Diagnosis not present

## 2024-06-11 DIAGNOSIS — E785 Hyperlipidemia, unspecified: Secondary | ICD-10-CM | POA: Diagnosis not present

## 2024-06-12 ENCOUNTER — Other Ambulatory Visit: Payer: Self-pay | Admitting: Internal Medicine

## 2024-06-12 DIAGNOSIS — R931 Abnormal findings on diagnostic imaging of heart and coronary circulation: Secondary | ICD-10-CM

## 2024-06-18 ENCOUNTER — Ambulatory Visit: Payer: Self-pay

## 2024-06-21 NOTE — Progress Notes (Unsigned)
 Cardiology Office Note:    Date:  06/21/2024   ID:  Caitlyn Rodriguez, DOB 1961/04/03, MRN [id number]  PCP:  Almeda Loa ORN, FNP  Cardiologist:  Stanly DELENA Leavens, MD { Click to update primary MD,subspecialty MD or APP then REFRESH:1}    Referring MD: Almeda Loa ORN, FNP   Chief Complaint: follow-up of stress test  History of Present Illness:    Caitlyn Rodriguez is a 63 y.o. female with a history of CAD with elevated coronary calcium  score of 148 (85th percentile for age and sex) in 04/2024 and mildly abnormal Myoview  in 05/2024, hypertension, hyperlipidemia, and obesity who is followed by Dr. Leavens and presents today for follow-up of stress test.   Patient was recently seen by Dr. Leavens in 04/2024 at which time she reported some hypotension at home but was otherwise doing okay from a cardiac standpoint. Coronary calcium  score was ordered to help with risk stratification and management of hyperlipidemia given family history of CAD. Lisinopril  was decreased. Coronary calcium  score was elevated at 148 (85th percentile for age and sex). Myoview  was then ordered and showed a mild reversible defect on the anterior/ anteroseptal bordered concerning for ischemia in the distribution of the LAD. However, overall, low risk given mild defect and focal location.   Patient presents today for follow-up. ***  Abnormal Stress Test CAD Coronary calcium  score was ordered at last visit in 04/2024 and was elevated at 148 (85th percentile for age and sex). Myoview  was then ordered and showed a mild reversible defect on the anterior/ anteroseptal bordered concerning for ischemia in the distribution of the LAD. However, overall, low risk given mild defect and focal location.  - *** - Continue aspirin  and statin.   Hypertension BP *** - Continue Lisinopril  5mg  daily.   Hyperlipidemia Lipid panel in 04/2024: Total Cholesterol 188, Triglycerides 114, HDL 53, LDL 115. LDL goal <70 given  CAD. - Currently on Crestor  5mg  daily. Will increase to 20mg  daily. *** - Will repeat lipid panel and LFTs in 6-8 weeks. ***  Obesity  ***  EKGs/Labs/Other Studies Reviewed:    The following studies were reviewed:  CT Cardiac Scoring 05/16/2024: Impression: Coronary calcium  score of 148. This was 72 percentile for age-, race-, and sex-matched controls. _______________  Myoview  06/01/2024:   Findings are consistent with ischemia. The study is low risk.   No ST deviation was noted. The ECG was negative for ischemia.   LV perfusion is abnormal. There is evidence of ischemia. Defect 1: There is a medium defect with mild reduction in uptake present in the apical to basal anterior location(s) that is reversible. There is normal wall motion in the defect area. Consistent with ischemia.   Left ventricular function is normal. Nuclear stress EF: 63%. The left ventricular ejection fraction is normal (55-65%). End diastolic cavity size is normal. End systolic cavity size is normal.   CT images were obtained for attenuation correction and were examined for the presence of coronary calcium  when appropriate.   Coronary calcium  was present on the attenuation correction CT images.   Prior study not available for comparison.   Mild reversible defect on the anterior/anteroseptal border seen on both attenuation corrected and uncorrected images, concerning for ischemia. Overall low risk given mild defect/focal location, but distribution consistent with LAD.   EKG:  EKG not ordered today.   Recent Labs: 05/07/2024: BUN 13; Creatinine, Ser 0.53; Potassium 4.8; Sodium 141  Recent Lipid Panel    Component Value Date/Time  CHOL 188 05/07/2024 0912   TRIG 114 05/07/2024 0912   HDL 53 05/07/2024 0912   CHOLHDL 3.5 05/07/2024 0912   LDLCALC 115 (H) 05/07/2024 0912    Physical Exam:    Vital Signs: There were no vitals taken for this visit.    Wt Readings from Last 3 Encounters:  04/26/24 185 lb  (83.9 kg)  03/21/23 211 lb (95.7 kg)  08/03/17 273 lb (123.8 kg)     General: 63 y.o. female in no acute distress. HEENT: Normocephalic and atraumatic. Sclera clear.  Neck: Supple. No carotid bruits. No JVD. Heart: *** RRR. Distinct S1 and S2. No murmurs, gallops, or rubs.  Lungs: No increased work of breathing. Clear to ausculation bilaterally. No wheezes, rhonchi, or rales.  Abdomen: Soft, non-distended, and non-tender to palpation.  Extremities: No lower extremity edema.  Radial and distal pedal pulses 2+ and equal bilaterally. Skin: Warm and dry. Neuro: No focal deficits. Psych: Normal affect. Responds appropriately.   Assessment:    No diagnosis found.  Plan:     Disposition: Follow up in ***   Signed, Earland Reish E Login Muckleroy, PA-C  06/21/2024 4:33 PM    [facility]

## 2024-06-26 NOTE — Progress Notes (Unsigned)
 Cardiology Office Note:    Date:  06/27/2024   ID:  Karna Loa Hurst, DOB [birth year], MRN [id number]  PCP:  Almeda Loa ORN, FNP  Cardiologist:  Stanly DELENA Leavens, MD Cardiology APP:  Chawn Spraggins E, PA-C     Referring MD: Almeda Loa ORN, FNP   Chief Complaint: follow-up of stress test  History of Present Illness:    Caitlyn Rodriguez is a 63 y.o. female with a history of CAD with elevated coronary calcium  score of 148 (85th percentile for age and sex) in 04/2024 and mildly abnormal Myoview  in 05/2024, asymptomatic PACs, hypertension, hyperlipidemia, and obesity who is followed by Dr. Leavens and presents today for follow-up of stress test.    Patient was recently seen by Dr. Leavens in 04/2024 at which time she reported some hypotension at home but was otherwise doing okay from a cardiac standpoint. Coronary calcium  score was ordered to help with risk stratification and management of hyperlipidemia given family history of CAD. Lisinopril  was decreased. Coronary calcium  score was elevated at 148 (85th percentile for age and sex). Myoview  was then ordered and showed a mild reversible defect on the anterior/ anteroseptal bordered concerning for ischemia in the distribution of the LAD. However, overall, low risk given mild defect and focal location.   Patient presents today for follow-up. Here alone. We reviewed stress test results. She is completely asymptomatic. No chest pain, shortness of breath, orthopnea, PND, edema, palpitations, lightheadedness/ dizziness, or syncope. She is very active. 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 BP stable on lower dose of Lisinopril .   EKGs/Labs/Other Studies Reviewed:    he following studies were reviewed:   CT Cardiac Scoring 05/16/2024: Impression: Coronary calcium  score of 148. This was 47 percentile for age-, race-, and sex-matched  controls. _______________   Myoview  06/01/2024:   Findings are consistent with ischemia. The study is low risk.   No ST deviation was noted. The ECG was negative for ischemia.   LV perfusion is abnormal. There is evidence of ischemia. Defect 1: There is a medium defect with mild reduction in uptake present in the apical to basal anterior location(s) that is reversible. There is normal wall motion in the defect area. Consistent with ischemia.   Left ventricular function is normal. Nuclear stress EF: 63%. The left ventricular ejection fraction is normal (55-65%). End diastolic cavity size is normal. End systolic cavity size is normal.   CT images were obtained for attenuation correction and were examined for the presence of coronary calcium  when appropriate.   Coronary calcium  was present on the attenuation correction CT images.   Prior study not available for comparison.   Mild reversible defect on the anterior/anteroseptal border seen on both attenuation corrected and uncorrected images, concerning for ischemia. Overall low risk given mild defect/focal location, but distribution consistent with LAD.     EKG:  EKG not ordered today.   Recent Labs: 05/07/2024: BUN 13; Creatinine, Ser 0.53; Potassium 4.8; Sodium 141  Recent Lipid Panel    Component Value Date/Time   CHOL 188 05/07/2024 0912   TRIG 114 05/07/2024 0912   HDL 53 05/07/2024 0912   CHOLHDL 3.5 05/07/2024 0912   LDLCALC 115 (H) 05/07/2024 0912    Physical Exam:    Vital Signs: BP 108/60   Pulse 66   Ht 5' 3 (1.6 m)   Wt 176 lb 9.6 oz (80.1  kg)   SpO2 95%   BMI 31.28 kg/m     Wt Readings from Last 3 Encounters:  06/27/24 176 lb 9.6 oz (80.1 kg)  04/26/24 185 lb (83.9 kg)  03/21/23 211 lb (95.7 kg)     General: 63 y.o. Caucasian female in no acute distress. HEENT: Normocephalic and atraumatic. Sclera clear.  Neck: Supple. No JVD. Heart: RRR. Distinct S1 and S2. No murmurs, gallops, or rubs.  Lungs: No increased  work of breathing. Clear to ausculation bilaterally. No wheezes, rhonchi, or rales.  Extremities: No lower extremity edema.   Skin: Warm and dry. Neuro: No focal deficits. Psych: Normal affect. Responds appropriately.   Assessment:    1. Coronary artery disease involving native coronary artery of native heart without angina pectoris   2. Primary hypertension   3. Hyperlipidemia, unspecified hyperlipidemia type     Plan:    CAD Family History of CAD Coronary calcium  score was ordered at last visit in 04/2024 and was elevated at 148 (85th percentile for age and sex). Myoview  was then ordered and showed a mild reversible defect on the anterior/ anteroseptal bordered concerning for ischemia in the distribution of the LAD. However, overall, low risk given mild defect and focal location.  - No anginal symptoms. - Continue aspirin  and statin.  - Given she is asymptomatic and stress low risk, will continue to treat medically. No indication for cardiac catheterization at this time. However, advised patient to let us  know if she develops any chest pain, shortness of breath, or decreased exercise tolerance.   Hypertension BP well controlled. Better on lower dose of Lisinopril . - Continue Lisinopril  5mg  daily.    Hyperlipidemia Lipid panel in 04/2024: Total Cholesterol 188, Triglycerides 114, HDL 53, LDL 115. LDL goal <70 given CAD. - She was started on Crestor  5mg  daily in 04/2024. She would like to wait for repeat labs before increasing this further. Continue current dose for now. She is due for repeat lipid panel in about 1 month. If LDL above goal at that time, would recommend increasing Crestor .   Disposition: Follow up in 1 year.   Signed, Aline FORBES Door, PA-C  06/27/2024 2:08 PM    [facility]

## 2024-06-27 ENCOUNTER — Ambulatory Visit: Attending: Cardiovascular Disease | Admitting: Student

## 2024-06-27 ENCOUNTER — Encounter: Payer: Self-pay | Admitting: Student

## 2024-06-27 VITALS — BP 108/60 | HR 66 | Ht 63.0 in | Wt 176.6 lb

## 2024-06-27 DIAGNOSIS — I10 Essential (primary) hypertension: Secondary | ICD-10-CM

## 2024-06-27 DIAGNOSIS — I2510 Atherosclerotic heart disease of native coronary artery without angina pectoris: Secondary | ICD-10-CM | POA: Diagnosis not present

## 2024-06-27 DIAGNOSIS — E785 Hyperlipidemia, unspecified: Secondary | ICD-10-CM

## 2024-06-27 NOTE — Patient Instructions (Signed)
 Medication Instructions:  Your physician recommends that you continue on your current medications as directed. Please refer to the Current Medication list given to you today.  *If you need a refill on your cardiac medications before your next appointment, please call your pharmacy*  Lab Work: None ordered If you have labs (blood work) drawn today and your tests are completely normal, you will receive your results only by: MyChart Message (if you have MyChart) OR A paper copy in the mail If you have any lab test that is abnormal or we need to change your treatment, we will call you to review the results.  Testing/Procedures: None ordered  Follow-Up: At [facility], you and your health needs are our priority.  As part of our continuing mission to provide you with exceptional heart care, our providers are all part of one team.  This team includes your primary Cardiologist (physician) and Advanced Practice Providers or APPs (Physician Assistants and Nurse Practitioners) who all work together to provide you with the care you need, when you need it.  Your next appointment:   1 year(s)  Provider:   Stanly DELENA Leavens, MD or Aline Door, PA-C          We recommend signing up for the patient portal called MyChart.  Sign up information is provided on this After Visit Summary.  MyChart is used to connect with patients for Virtual Visits (Telemedicine).  Patients are able to view lab/test results, encounter notes, upcoming appointments, etc.  Non-urgent messages can be sent to your provider as well.   To learn more about what you can do with MyChart, go to [email or web address].   Other Instructions

## 2024-07-02 DIAGNOSIS — Z1231 Encounter for screening mammogram for malignant neoplasm of breast: Secondary | ICD-10-CM | POA: Diagnosis not present

## 2024-07-02 DIAGNOSIS — Z01419 Encounter for gynecological examination (general) (routine) without abnormal findings: Secondary | ICD-10-CM | POA: Diagnosis not present

## 2024-07-10 DIAGNOSIS — M5432 Sciatica, left side: Secondary | ICD-10-CM | POA: Diagnosis not present

## 2024-07-10 DIAGNOSIS — M545 Low back pain, unspecified: Secondary | ICD-10-CM | POA: Diagnosis not present

## 2024-07-23 DIAGNOSIS — H401123 Primary open-angle glaucoma, left eye, severe stage: Secondary | ICD-10-CM | POA: Diagnosis not present

## 2024-07-23 DIAGNOSIS — H401112 Primary open-angle glaucoma, right eye, moderate stage: Secondary | ICD-10-CM | POA: Diagnosis not present

## 2024-11-11 DIAGNOSIS — S6991XA Unspecified injury of right wrist, hand and finger(s), initial encounter: Secondary | ICD-10-CM | POA: Diagnosis not present

## 2024-11-11 DIAGNOSIS — M47812 Spondylosis without myelopathy or radiculopathy, cervical region: Secondary | ICD-10-CM | POA: Diagnosis not present

## 2024-11-11 DIAGNOSIS — M542 Cervicalgia: Secondary | ICD-10-CM | POA: Diagnosis not present

## 2024-11-11 DIAGNOSIS — S60221A Contusion of right hand, initial encounter: Secondary | ICD-10-CM | POA: Diagnosis not present

## 2024-11-11 DIAGNOSIS — S0083XA Contusion of other part of head, initial encounter: Secondary | ICD-10-CM | POA: Diagnosis not present

## 2024-11-11 DIAGNOSIS — S0993XA Unspecified injury of face, initial encounter: Secondary | ICD-10-CM | POA: Diagnosis not present

## 2024-11-11 DIAGNOSIS — W010XXA Fall on same level from slipping, tripping and stumbling without subsequent striking against object, initial encounter: Secondary | ICD-10-CM | POA: Diagnosis not present

## 2024-11-11 DIAGNOSIS — S0990XA Unspecified injury of head, initial encounter: Secondary | ICD-10-CM | POA: Diagnosis not present

## 2024-11-11 DIAGNOSIS — R220 Localized swelling, mass and lump, head: Secondary | ICD-10-CM | POA: Diagnosis not present
